# Patient Record
Sex: Male | Born: 1980 | Race: White | Hispanic: No
Health system: Southern US, Community
[De-identification: ages and names within clinical notes are randomized; demographics above are authoritative.]

---

## 2004-08-16 ENCOUNTER — Ambulatory Visit: Payer: Self-pay | Admitting: Family Medicine

## 2004-08-23 ENCOUNTER — Ambulatory Visit: Payer: Self-pay | Admitting: Family Medicine

## 2006-04-18 ENCOUNTER — Ambulatory Visit: Payer: Self-pay | Admitting: Internal Medicine

## 2006-05-01 ENCOUNTER — Encounter: Payer: Self-pay | Admitting: Family Medicine

## 2006-05-01 DIAGNOSIS — F1010 Alcohol abuse, uncomplicated: Secondary | ICD-10-CM | POA: Insufficient documentation

## 2006-05-01 DIAGNOSIS — K219 Gastro-esophageal reflux disease without esophagitis: Secondary | ICD-10-CM | POA: Insufficient documentation

## 2006-05-01 DIAGNOSIS — F17200 Nicotine dependence, unspecified, uncomplicated: Secondary | ICD-10-CM

## 2006-05-01 DIAGNOSIS — M549 Dorsalgia, unspecified: Secondary | ICD-10-CM | POA: Insufficient documentation

## 2007-02-19 ENCOUNTER — Emergency Department (HOSPITAL_COMMUNITY): Admission: EM | Admit: 2007-02-19 | Discharge: 2007-02-19 | Payer: Self-pay | Admitting: Emergency Medicine

## 2007-12-30 ENCOUNTER — Emergency Department: Payer: Self-pay | Admitting: Emergency Medicine

## 2007-12-31 ENCOUNTER — Inpatient Hospital Stay (HOSPITAL_COMMUNITY): Admission: RE | Admit: 2007-12-31 | Discharge: 2008-01-01 | Payer: Self-pay | Admitting: Psychiatry

## 2007-12-31 ENCOUNTER — Ambulatory Visit: Payer: Self-pay | Admitting: Psychiatry
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
## 2009-08-22 ENCOUNTER — Encounter (INDEPENDENT_AMBULATORY_CARE_PROVIDER_SITE_OTHER): Payer: Self-pay | Admitting: *Deleted

## 2010-02-13 NOTE — Letter (Signed)
Summary: [name] letter  Matthew Walton at [facility]  [address] [place], [place] [address]   Phone: [phone]  Fax: [phone]       08/22/2009 MRN: [id number]  Matthew Walton [address] [place], [place]  [address]  Dear [name],  [facility], and [facility] announce the retirement of Arta Silence, M.D., from full-time practice at the [facility] office effective July 13, 2009 and his plans of returning part-time.  It is important to Dr. Hetty Ely and to our practice that you understand that [facility] has seven physicians in our office for your health care needs.  We will continue to offer the same exceptional care that you have today.    Dr. Hetty Ely has spoken to many of you about his plans for retirement and returning part-time in the fall.   We will continue to work with you through the transition to schedule appointments for you in the office and meet the high standards that [facility] is committed to.   Again, it is with great pleasure that we share the news that Dr. Hetty Ely will return to [facility] at [facility] in October of 2011 with a reduced schedule.    If you have any questions, or would like to request an appointment with one of our physicians, please call us at [phone] and press the option for Scheduling an appointment.  We take pleasure in providing you with excellent patient care and look forward to seeing you at your next office visit.  Our [facility] Physicians are:  Tillman Abide, M.D. Laurita Quint, M.D. Roxy Manns, M.D. Kerby Nora, M.D. Hannah Beat, M.D. Ruthe Mannan, M.D. We proudly welcomed Matthew Walton, M.D. and Matthew Walton, M.D. to the practice in July/August 2011.  Sincerely,   [facility]

## 2010-06-01 NOTE — Discharge Summary (Signed)
Matthew Walton, Matthew Walton               ACCOUNT NO.:  [id number]   MEDICAL RECORD NO.:  [id number]          PATIENT TYPE:  IPS   LOCATION:  0501                          FACILITY:  [facility]   PHYSICIAN:  Geoffery Lyons, M.D.      DATE OF BIRTH:  [birth year]   DATE OF ADMISSION:  12/31/2007  DATE OF DISCHARGE:  01/01/2008                               DISCHARGE SUMMARY   CHIEF COMPLAINT/HISTORY OF PRESENT ILLNESS:  This was the first  admission to [facility] for this 30 year old male  voluntarily admitted.  He called his mother to pick him up to go to the  hospital.  He felt really low, drinking a fifth of vodka or a bottle of  wine daily, out of control.  He endorsed mood swings.  He endorsed being  very high or very low.  He is not sure if the mood changes are alcohol  induced.   PAST PSYCHIATRIC HISTORY:  First time at [facility].  Has had  prior detox in [facility] 1 year ago.  Sees Dr. Terance Ice.   ALCOHOL AND DRUG HISTORY:  As already stated, persistent use of alcohol.  Has prior history of a DUI.  Family history of alcohol dependence.   MEDICAL HISTORY:  Noncontributory.   MEDICATIONS:  Prozac 40 mg per day.   PHYSICAL EXAMINATION:  Failed to show any acute findings.   LABORATORY WORKUP:  Not available in the chart.   MENTAL STATUS EXAMINATION:  Reveals an alert cooperative male.  Mood  euthymic, affect broad.  He thought processes were logical, coherent and  relevant.  Denies any active suicidal or homicidal ideas.  No evidence  of delusions or hallucinations.  Cognition well preserved.   ADMISSION DIAGNOSES:  AXIS I:  Alcohol dependence.  Depressive disorder  not otherwise specified.  AXIS II: No diagnosis.  AXIS III:  No diagnosis.  AXIS IV:  Moderate.  AXIS V:  GAF on admission 35.  Highest GAF in the last year 70.   COURSE IN THE HOSPITAL:  The patient was admitted, started on individual  and group psychotherapy.  As already stated, he has been a binge  drinker  for a long time.  Started drinking a fifth of liquor a night for the  last month.  He started to become more angry and reckless when he was  drinking.  We started the detox process.  On December 18th, he was  wanting to be discharged.  He endorsed that he was safe to go.  He  endorsed he was committed to abstinence but felt that he could safely  continue the detox at home.  He was willing to pursue outpatient  treatment.  We went ahead and discharged him to outpatient follow-up.   DISCHARGE DIAGNOSES:  AXIS I:  Alcohol dependence.  Depressive disorder  not otherwise specified.  AXIS II:  No diagnosis.  AXIS III:  No diagnosis.  AXIS IV:  Moderate.  AXIS V:  GAF upon discharge 50-55.   DISCHARGE MEDICATIONS:  Discharged on Librium taper 25 mg, two twice a  day on December  18th, then one three times December 19th, then twice a  day on December 20th and once on December 21st, then discontinue.  Continue Prozac 10 mg per day.   FOLLOWUP:  Follow up at [facility].      Geoffery Lyons, M.D.  Electronically Signed     IL/MEDQ  D:  02/03/2008  T:  02/03/2008  Job:  [id number]

## 2010-10-05 LAB — DIFFERENTIAL
Basophils Absolute: 0.1
Basophils Relative: 1
Lymphocytes Relative: 14
Monocytes Absolute: 1
Neutro Abs: 10 — ABNORMAL HIGH
Neutrophils Relative %: 76

## 2010-10-05 LAB — BASIC METABOLIC PANEL
Calcium: 8.5
Creatinine, Ser: 0.96
GFR calc Af Amer: >60
GFR calc non Af Amer: >60
Glucose, Bld: 114 — ABNORMAL HIGH
Sodium: 133 — ABNORMAL LOW

## 2010-10-05 LAB — CBC
Hemoglobin: 15.1
RDW: 12.9

## 2019-04-02 ENCOUNTER — Ambulatory Visit: Payer: 59 | Attending: Internal Medicine

## 2019-04-02 DIAGNOSIS — Z23 Encounter for immunization: Secondary | ICD-10-CM

## 2019-04-02 NOTE — Progress Notes (Signed)
   Covid-19 Vaccination Clinic  Name:  Matthew Walton    MRN: [id number] DOB: 12-30-1980  04/02/2019  Matthew Walton was observed post Covid-19 immunization for 15 minutes without incident. He was provided with Vaccine Information Sheet and instruction to access the V-Safe system.   Matthew Walton was instructed to call 911 with any severe reactions post vaccine: Marland Kitchen Difficulty breathing  . Swelling of face and throat  . A fast heartbeat  . A bad rash all over body  . Dizziness and weakness   Immunizations Administered    Name Date Dose VIS Date Route   Pfizer COVID-19 Vaccine 04/02/2019  9:51 AM 0.3 mL 12/25/2018 Intramuscular   Manufacturer: [employer], [employer]   Lot: [id number]   NDC: [address]-2

## 2019-04-27 ENCOUNTER — Ambulatory Visit: Payer: 59 | Attending: Internal Medicine

## 2019-04-27 DIAGNOSIS — Z23 Encounter for immunization: Secondary | ICD-10-CM

## 2019-04-27 NOTE — Progress Notes (Signed)
   Covid-19 Vaccination Clinic  Name:  Matthew Walton    MRN: [id number] DOB: 03-May-1980  04/27/2019  Matthew Walton was observed post Covid-19 immunization for 15 minutes without incident. He was provided with Vaccine Information Sheet and instruction to access the V-Safe system.   Matthew Walton was instructed to call 911 with any severe reactions post vaccine: Marland Kitchen Difficulty breathing  . Swelling of face and throat  . A fast heartbeat  . A bad rash all over body  . Dizziness and weakness   Immunizations Administered    Name Date Dose VIS Date Route   Pfizer COVID-19 Vaccine 04/27/2019 10:46 AM 0.3 mL 12/25/2018 Intramuscular   Manufacturer: [employer], [employer]   Lot: [id number]   NDC: [address]-5
